# Patient Record
Sex: Female | Born: 1970 | Race: White | Hispanic: No | Smoking: Current every day smoker
Health system: Southern US, Community
[De-identification: ages and names within clinical notes are randomized; demographics above are authoritative.]

## PROBLEM LIST (undated history)

## (undated) DIAGNOSIS — I10 Essential (primary) hypertension: Secondary | ICD-10-CM

## (undated) DIAGNOSIS — N946 Dysmenorrhea, unspecified: Secondary | ICD-10-CM

## (undated) DIAGNOSIS — D649 Anemia, unspecified: Secondary | ICD-10-CM

## (undated) DIAGNOSIS — G8929 Other chronic pain: Secondary | ICD-10-CM

## (undated) DIAGNOSIS — N920 Excessive and frequent menstruation with regular cycle: Secondary | ICD-10-CM

## (undated) DIAGNOSIS — R102 Pelvic and perineal pain: Secondary | ICD-10-CM

## (undated) DIAGNOSIS — E559 Vitamin D deficiency, unspecified: Secondary | ICD-10-CM

## (undated) DIAGNOSIS — N809 Endometriosis, unspecified: Secondary | ICD-10-CM

## (undated) HISTORY — DX: Anemia, unspecified: D64.9

## (undated) HISTORY — DX: Essential (primary) hypertension: I10

## (undated) HISTORY — DX: Vitamin D deficiency, unspecified: E55.9

## (undated) HISTORY — DX: Excessive and frequent menstruation with regular cycle: N92.0

## (undated) HISTORY — DX: Dysmenorrhea, unspecified: N94.6

## (undated) HISTORY — PX: WISDOM TOOTH EXTRACTION: SHX21

## (undated) HISTORY — DX: Pelvic and perineal pain: R10.2

## (undated) HISTORY — DX: Endometriosis, unspecified: N80.9

## (undated) HISTORY — DX: Other chronic pain: G89.29

---

## 2013-04-06 ENCOUNTER — Encounter: Payer: Self-pay | Admitting: Nurse Practitioner

## 2013-05-10 ENCOUNTER — Encounter: Payer: Self-pay | Admitting: Nurse Practitioner

## 2013-08-10 ENCOUNTER — Emergency Department (HOSPITAL_BASED_OUTPATIENT_CLINIC_OR_DEPARTMENT_OTHER)
Admission: EM | Admit: 2013-08-10 | Discharge: 2013-08-10 | Discharge status: Against Medical Advice | Payer: BC Managed Care – PPO | Attending: Emergency Medicine | Admitting: Emergency Medicine

## 2013-08-10 ENCOUNTER — Encounter (HOSPITAL_BASED_OUTPATIENT_CLINIC_OR_DEPARTMENT_OTHER): Payer: Self-pay | Admitting: Emergency Medicine

## 2013-08-10 DIAGNOSIS — S46909A Unspecified injury of unspecified muscle, fascia and tendon at shoulder and upper arm level, unspecified arm, initial encounter: Secondary | ICD-10-CM | POA: Insufficient documentation

## 2013-08-10 DIAGNOSIS — Y9389 Activity, other specified: Secondary | ICD-10-CM | POA: Insufficient documentation

## 2013-08-10 DIAGNOSIS — F17200 Nicotine dependence, unspecified, uncomplicated: Secondary | ICD-10-CM | POA: Insufficient documentation

## 2013-08-10 DIAGNOSIS — X58XXXA Exposure to other specified factors, initial encounter: Secondary | ICD-10-CM | POA: Insufficient documentation

## 2013-08-10 DIAGNOSIS — Y929 Unspecified place or not applicable: Secondary | ICD-10-CM | POA: Insufficient documentation

## 2013-08-10 DIAGNOSIS — S4980XA Other specified injuries of shoulder and upper arm, unspecified arm, initial encounter: Secondary | ICD-10-CM | POA: Insufficient documentation

## 2013-08-10 NOTE — ED Notes (Signed)
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
## 2014-04-29 HISTORY — PX: PARTIAL HYSTERECTOMY: SHX80

## 2019-11-12 ENCOUNTER — Encounter: Payer: Self-pay | Admitting: Nurse Practitioner

## 2019-11-12 ENCOUNTER — Other Ambulatory Visit (INDEPENDENT_AMBULATORY_CARE_PROVIDER_SITE_OTHER): Payer: BC Managed Care – PPO

## 2019-11-12 ENCOUNTER — Ambulatory Visit (INDEPENDENT_AMBULATORY_CARE_PROVIDER_SITE_OTHER): Payer: BC Managed Care – PPO | Admitting: Nurse Practitioner

## 2019-11-12 VITALS — BP 142/86 | HR 73 | Ht 63.0 in | Wt 152.2 lb

## 2019-11-12 DIAGNOSIS — R197 Diarrhea, unspecified: Secondary | ICD-10-CM | POA: Diagnosis not present

## 2019-11-12 DIAGNOSIS — R1032 Left lower quadrant pain: Secondary | ICD-10-CM | POA: Diagnosis not present

## 2019-11-12 DIAGNOSIS — R1012 Left upper quadrant pain: Secondary | ICD-10-CM

## 2019-11-12 LAB — C-REACTIVE PROTEIN: CRP: <1 mg/dL (ref 0.5–20.0)

## 2019-11-12 LAB — CBC WITH DIFFERENTIAL/PLATELET
Basophils Absolute: 0.1 10*3/uL (ref 0.0–0.1)
Basophils Relative: 0.7 % (ref 0.0–3.0)
Eosinophils Absolute: 0.1 10*3/uL (ref 0.0–0.7)
Eosinophils Relative: 1.5 % (ref 0.0–5.0)
HCT: 45.9 % (ref 36.0–46.0)
Hemoglobin: 15.3 g/dL — ABNORMAL HIGH (ref 12.0–15.0)
Lymphocytes Relative: 26.6 % (ref 12.0–46.0)
Lymphs Abs: 2.7 10*3/uL (ref 0.7–4.0)
MCHC: 33.3 g/dL (ref 30.0–36.0)
MCV: 86.6 fl (ref 78.0–100.0)
Monocytes Absolute: 0.8 10*3/uL (ref 0.1–1.0)
Monocytes Relative: 8.3 % (ref 3.0–12.0)
Neutro Abs: 6.4 10*3/uL (ref 1.4–7.7)
Neutrophils Relative %: 62.9 % (ref 43.0–77.0)
Platelets: 330 10*3/uL (ref 150.0–400.0)
RBC: 5.3 Mil/uL — ABNORMAL HIGH (ref 3.87–5.11)
RDW: 14.3 % (ref 11.5–15.5)
WBC: 10.1 10*3/uL (ref 4.0–10.5)

## 2019-11-12 LAB — BASIC METABOLIC PANEL
BUN: 11 mg/dL (ref 6–23)
CO2: 26 mEq/L (ref 19–32)
Calcium: 9.8 mg/dL (ref 8.4–10.5)
Chloride: 101 mEq/L (ref 96–112)
Creatinine, Ser: 0.65 mg/dL (ref 0.40–1.20)
GFR: 96.17 mL/min (ref >60.00)
Glucose, Bld: 91 mg/dL (ref 70–99)
Potassium: 3.7 mEq/L (ref 3.5–5.1)
Sodium: 136 mEq/L (ref 135–145)

## 2019-11-12 LAB — IGA: IgA: 249 mg/dL (ref 68–378)

## 2019-11-12 MED ORDER — NA SULFATE-K SULFATE-MG SULF 17.5-3.13-1.6 GM/177ML PO SOLN: 1.0000 | Freq: Once | ORAL | 0 refills | Status: AC

## 2019-11-12 MED ORDER — DICYCLOMINE HCL 20 MG PO TABS: 20.0000 mg | ORAL_TABLET | Freq: Two times a day (BID) | ORAL | 1 refills | Status: AC

## 2019-11-12 NOTE — Progress Notes (Signed)
11/12/2019 April Blackburn [id number] 12/28/1970   CHIEF COMPLAINT: Diarrhea, LUQ and LLQ pain   HISTORY OF PRESENT ILLNESS: April Blackburn is a 49 year old female with a remote history of anemia secondary to menorrhagis, hypertension, fatty liver and Vitamin D deficiency. Partial hysterectomy 2016. She presents today as referred by her primary care provider Nena Polio NP for further evaluation for diarrhea and left upper and left lower abdominal pain. She developed LUQ/LLQ pain which radiated to her flank and back with associated  nonbloody diarrhea which started on 09/29/2019. Her abdominal pain comes and goes, some days is constant. Sometimes eating worsens here abdominal pain. No specific food triggers. She is intermittently taking Imodium one tab 4 to 6 times. No new medications within the past 6 months. She was seen by her PCP 6/7 and an abdominal/pelvic CT was done which showed a normal bowel and colon. No evidence of any acute intra abdominal/pelvic inflammatory or infectious process. Hepatic steatosis and small bilateral renal cysts were noted.  Her abdominal pain continued and she was seen by her  PCP on  6/14, she was  prescribed Methylprednisolone taper which she took for 4 days then stopped due to having significant anxiety while taking it. Past history of anxiety induced on Prednisone.  She was then prescribed a course of Cipro 500mg  po bid  on 10/18/2019 x 14 days without improvement. Labs done 10/18/2019 showed a WBC 9.3. Hg 15.1. HCT 47.4. PLT 298. Sed rate 3. Glu 85. BUN 6. Cr. 0.61. NA 139. Ca 9.3. Albumin 4.2. T. Bili 0.4. AST 10. ALT 18. She has lost 4lbs over the past 6 weeks.   She reports having a history of "IBS-D" for the past 2 to 3 years. She describes having episodes of N/V/D with lower generalized abdominal cramping, contraction like abdominal pain with sweats and sometimes feels flushed which occurs once every 3 to 4 months and lasts for less than one day. No specific food  or stress triggers. She's never had a colonoscopy. She takes Dicyclomine bid for the past 2 years. No family history of IBD. Paternal great uncle had colon cancer. She rarely takes NSAIDS, once or twice monthly for headaches.    She reports having excessive vomiting with sedation and anesthesia.   Abdominal/Pelvic CT with contrast 10/04/2019 at [facility]: 1. No acute abnormalities or suggested etiology for symptoms. 2. Hepatic steatosis. 3. Small bilateral renal cortical cysts  Past Medical History:  Diagnosis Date  . Anemia   . Chronic headaches   . Dysmenorrhea   . Endometriosis determined by laparoscopy   . Hypertension   . Menorrhagia with regular cycle   . Pelvic pain in female   . Vitamin D deficiency    Past Surgical History:  Procedure Laterality Date  . PARTIAL HYSTERECTOMY  2016  . WISDOM TOOTH EXTRACTION     Social History: Divorced. She has 2 daughters and 1 son. She smokes cigarettes 1ppd x 25 - 30 years. One alcoholic drink once 5 or 6 months. No drug use.   Family History: Mother died 41 due to hemorrhage in brain. Father age 3 history of colon polyps. Paternal great uncle colon cancer. Maternal grandmother pancreatic cancer. Maternal great great grandmother stomach cancer. Great maternal aunt with breast cancer.    Allergies  Allergen Reactions  . Prednisone Anxiety    Other reaction(s): Agitation, Chest Pain, Confusion, Other Patient had really bad mood swings  . Other     Other reaction(s):  Agitation  . Penicillins Hives  . Tylox [Oxycodone-Acetaminophen]     Violent      Outpatient Encounter Medications as of 11/12/2019  Medication Sig  . amLODipine (NORVASC) 10 MG tablet Take 10 mg by mouth daily.   . Biotin 2.5 MG CAPS Take 1 tablet by mouth daily.  Marland Kitchen dicyclomine (BENTYL) 20 MG tablet Take 1 tablet by mouth 2 (two) times daily.  . ergocalciferol (VITAMIN D2) 1.25 MG (50000 UT) capsule Take by mouth.  . Loperamide HCl (IMODIUM PO) Take 1 tablet  by mouth daily as needed.  . naproxen (NAPROSYN) 250 MG tablet TAKE 1 TABLET(250 MG) BY MOUTH TWICE DAILY WITH MEALS  . olmesartan-hydrochlorothiazide (BENICAR HCT) 20-12.5 MG tablet Take 1 tablet by mouth daily.  . [DISCONTINUED] cholecalciferol (VITAMIN D) 1000 UNITS tablet Take 1,000 Units by mouth daily. (Patient not taking: Reported on 11/12/2019)  . [DISCONTINUED] pravastatin (PRAVACHOL) 20 MG tablet Take by mouth. (Patient not taking: Reported on 11/12/2019)   No facility-administered encounter medications on file as of 11/12/2019.     REVIEW OF SYSTEMS:   Gen: + sweats, weight loss. No fever.   CV: Denies chest pain, palpitations or edema. Resp: Denies cough, shortness of breath of hemoptysis.  GI: See HPI.  GU : Denies urinary burning, blood in urine, increased urinary frequency or incontinence. MS: Denies joint pain, muscles aches or weakness. Derm: Denies rash, itchiness, skin lesions or unhealing ulcers. Psych: Denies depression, anxiety, memory loss or confusion. Heme: Denies bruising, bleeding. Neuro:  Denies headaches, dizziness or paresthesias. Endo:  Denies any problems with DM, thyroid or adrenal function.  PHYSICAL EXAM: BP (!) 142/86 (BP Location: Left Arm, Patient Position: Sitting, Cuff Size: Normal)   Pulse 73   Ht 5\' 3"  (1.6 m)   Wt 152 lb 4 oz (69.1 kg)   SpO2 97%   BMI 26.97 kg/m     General: Well developed  49 year old female in no acute distress. Head: Normocephalic and atraumatic. Eyes:  Sclerae non-icteric, conjunctive pink. Ears: Normal auditory acuity. Mouth: Dentition intact. No ulcers or lesions.  Neck: Supple, no lymphadenopathy or thyromegaly.  Lungs: Clear bilaterally to auscultation without wheezes, crackles or rhonchi. Heart: Regular rate and rhythm. No murmur, rub or gallop appreciated.  Abdomen: Soft, non distended. Moderate LUQ and LLQ tenderness without rebound or guarding. Mild right CVA tenderness, no left CVA tenderness. No masses.  No hepatosplenomegaly. Normoactive bowel sounds x 4 quadrants.  Rectal: Deferred.  Musculoskeletal: Symmetrical with no gross deformities. Skin: Warm and dry. No rash or lesions on visible extremities. Extremities: No edema. Neurological: Alert oriented x 4, no focal deficits.  Psychological:  Alert and cooperative. Normal mood and affect.  ASSESSMENT AND PLAN:  22. 49 year old female with LUQ, LLQ pain and diarrhea since 09/29/2019. -GI pathogen panel, CBC, BMP, CRP, IgA and tTg level -EGD/Colonoscopy to rule out PUD, celiac disease, IBD, microscopic colitis and upper/lower GI malignancy. EGD/colonosocpy benefits and risks discussed including risk with sedation, risk of bleeding, perforation and infection  -Dicyclomine 20mg  po bid PRN  -Patient to call our office if he symptoms worsen.  -No NSAIDs  2. Episodic N/V/D with lower abdominal pain, sweats and sometimes flushing which occurs once monthly over the past 2 to 3 years. Etiology unclear: IBD vs neuroendocrine tumors (carcinoid syndrome) vs  ace inhibitor induced abdominal angioedema.  -Recommend abd/pelvic CT angiogram to be done at time of next attack. -Recommend checking C1 esterase inhibitor and C 4 levels at time  of next attack.  -Diagnostic EGD/colonoscopy as noted above  -Patient to call our office at time of next attack   3. HTN on Olmesartan.  -Will check with PCP to verify when Olmesartan HCTZ was initially prescribed   4. Fatty liver per CTAP. LFTs normal.          CC:  Nena Polio, NP

## 2019-11-12 NOTE — Patient Instructions (Signed)
If you are age 51 or older, your body mass index should be between 23-30. Your Body mass index is 26.97 kg/m. If this is out of the aforementioned range listed, please consider follow up with your Primary Care Provider.  If you are age 16 or younger, your body mass index should be between 19-25. Your Body mass index is 26.97 kg/m. If this is out of the aformentioned range listed, please consider follow up with your Primary Care Provider.   Your provider has requested that you go to the basement level for lab work before leaving today. Press "B" on the elevator. The lab is located at the first door on the left as you exit the elevator.  We have sent the following medications to your pharmacy for you to pick up at your convenience: suprep Dicyclomine 20mg  1 tabled twice a day for abdominal cramping as needed  Due to recent changes in healthcare laws, you may see the results of your imaging and laboratory studies on MyChart before your provider has had a chance to review them.  We understand that in some cases there may be results that are confusing or concerning to you. Not all laboratory results come back in the same time frame and the provider may be waiting for multiple results in order to interpret others.  Please give [place] 48 hours in order for your provider to thoroughly review all the results before contacting the office for clarification of your results.

## 2019-11-13 DIAGNOSIS — R1032 Left lower quadrant pain: Secondary | ICD-10-CM | POA: Insufficient documentation

## 2019-11-13 DIAGNOSIS — R197 Diarrhea, unspecified: Secondary | ICD-10-CM | POA: Insufficient documentation

## 2019-11-13 DIAGNOSIS — R1012 Left upper quadrant pain: Secondary | ICD-10-CM | POA: Insufficient documentation

## 2019-11-15 LAB — TISSUE TRANSGLUTAMINASE, IGA: (tTG) Ab, IgA: 1 U/mL

## 2019-11-17 ENCOUNTER — Telehealth: Payer: Self-pay | Admitting: Nurse Practitioner

## 2019-11-17 NOTE — Telephone Encounter (Signed)
Beth, I just spoke to the patient. No BM for almost 2 days. No diarrhea therefore GI pathogen panel not done. She is not having an "attack" of abd pain, N/V/D, sweats and flushing. So no stat labs or abd/pelvic CTA today. She will take Dicyclomine 20mg  tid, Miralax and she will call office tomorrow with an update. She will go to the ED if she has severe pain.   If she has worsening abd pain with N/V/D and sweats then labs to include CBC, CMP, C1 esterase inhibitor and C4 levels with stat abd/pelvic CT angio would be best option. Thx

## 2019-11-17 NOTE — Telephone Encounter (Signed)
Pain comes and goes but has been consistent today. She has moved slowly today. The pain started in the back below ribcage and has moved to the front below the ribs.

## 2019-11-17 NOTE — Telephone Encounter (Signed)
She mentioned the CT. Stated if she was hurting tomorrow she would not go to work. Can you give me a time within which the CT should be done (STAT or ASAP)? What diagnosis do you want to use.

## 2019-11-17 NOTE — Telephone Encounter (Signed)
Patient was told to give [place] a call if the pain on her side gets worse. Pt states that pain today is much worse and is radiating to her back.

## 2019-11-17 NOTE — Telephone Encounter (Signed)
April Blackburn, I called the patient and left a message on her voicemail to call me back. If she is having severe abdominal pain then she should go to the ER. If she is having an episode of N/V/D and sweats or  flushing then please schedule her for an abd/pelvic CT angiogram asap and labs to be done include CBC, CMP, C1 esterase inhibitor and C4. Levels.  Note, I am not in the office tomorrow 7/22.

## 2019-11-18 ENCOUNTER — Other Ambulatory Visit: Payer: Self-pay

## 2019-11-18 ENCOUNTER — Ambulatory Visit (HOSPITAL_BASED_OUTPATIENT_CLINIC_OR_DEPARTMENT_OTHER)
Admission: RE | Admit: 2019-11-18 | Discharge: 2019-11-18 | Disposition: A | Discharge status: Home / Self Care | Payer: BC Managed Care – PPO | Source: Ambulatory Visit | Attending: Nurse Practitioner | Admitting: Nurse Practitioner

## 2019-11-18 ENCOUNTER — Other Ambulatory Visit (INDEPENDENT_AMBULATORY_CARE_PROVIDER_SITE_OTHER): Payer: BC Managed Care – PPO

## 2019-11-18 DIAGNOSIS — R197 Diarrhea, unspecified: Secondary | ICD-10-CM

## 2019-11-18 DIAGNOSIS — R1032 Left lower quadrant pain: Secondary | ICD-10-CM | POA: Diagnosis not present

## 2019-11-18 DIAGNOSIS — A09 Infectious gastroenteritis and colitis, unspecified: Secondary | ICD-10-CM | POA: Insufficient documentation

## 2019-11-18 DIAGNOSIS — R1012 Left upper quadrant pain: Secondary | ICD-10-CM | POA: Diagnosis not present

## 2019-11-18 LAB — COMPREHENSIVE METABOLIC PANEL
ALT: 17 U/L (ref 0–35)
AST: 12 U/L (ref 0–37)
Albumin: 4.4 g/dL (ref 3.5–5.2)
Alkaline Phosphatase: 46 U/L (ref 39–117)
BUN: 18 mg/dL (ref 6–23)
CO2: 27 mEq/L (ref 19–32)
Calcium: 9.4 mg/dL (ref 8.4–10.5)
Chloride: 101 mEq/L (ref 96–112)
Creatinine, Ser: 0.68 mg/dL (ref 0.40–1.20)
GFR: 91.28 mL/min (ref >60.00)
Glucose, Bld: 99 mg/dL (ref 70–99)
Potassium: 3.7 mEq/L (ref 3.5–5.1)
Sodium: 135 mEq/L (ref 135–145)
Total Bilirubin: 0.9 mg/dL (ref 0.2–1.2)
Total Protein: 7.2 g/dL (ref 6.0–8.3)

## 2019-11-18 LAB — CBC WITH DIFFERENTIAL/PLATELET
Basophils Absolute: 0 10*3/uL (ref 0.0–0.1)
Basophils Relative: 0.5 % (ref 0.0–3.0)
Eosinophils Absolute: 0.1 10*3/uL (ref 0.0–0.7)
Eosinophils Relative: 1.3 % (ref 0.0–5.0)
HCT: 46.9 % — ABNORMAL HIGH (ref 36.0–46.0)
Hemoglobin: 15.6 g/dL — ABNORMAL HIGH (ref 12.0–15.0)
Lymphocytes Relative: 24.8 % (ref 12.0–46.0)
Lymphs Abs: 2.5 10*3/uL (ref 0.7–4.0)
MCHC: 33.2 g/dL (ref 30.0–36.0)
MCV: 87.5 fl (ref 78.0–100.0)
Monocytes Absolute: 0.7 10*3/uL (ref 0.1–1.0)
Monocytes Relative: 7.3 % (ref 3.0–12.0)
Neutro Abs: 6.7 10*3/uL (ref 1.4–7.7)
Neutrophils Relative %: 66.1 % (ref 43.0–77.0)
Platelets: 339 10*3/uL (ref 150.0–400.0)
RBC: 5.36 Mil/uL — ABNORMAL HIGH (ref 3.87–5.11)
RDW: 13.9 % (ref 11.5–15.5)
WBC: 10.1 10*3/uL (ref 4.0–10.5)

## 2019-11-18 MED ORDER — IOHEXOL 350 MG/ML SOLN
100.0000 mL | Freq: Once | INTRAVENOUS | Status: AC | PRN
  Administered 2019-11-18: 100 mL via INTRAVENOUS

## 2019-11-18 NOTE — Telephone Encounter (Signed)
CT angiogram is authorized by her insurance. She is presently getting her labs drawn. Reports she did have some passage of stool this morning. She last ate at 12:30 pm.

## 2019-11-18 NOTE — Telephone Encounter (Signed)
Dr Tarri Glenn, you may get a call with the results on this patient's CT. The STAT has turned into an after hours.  [facility] is beginning her imaging as soon as she gets there. The patient will be released after her imaging. She knows she may not get any information tonight.  Patient seen by Carl Best

## 2019-11-18 NOTE — Telephone Encounter (Signed)
Patient wants to go forward with labs and the CT. She is still in pain despite following the recommendations.

## 2019-11-18 NOTE — Telephone Encounter (Signed)
Pt is requesting a call back from a nurse to discuss the options Jaclyn Shaggy suggested

## 2019-11-18 NOTE — Telephone Encounter (Signed)
Noted! Thank you

## 2019-11-19 ENCOUNTER — Other Ambulatory Visit: Payer: BC Managed Care – PPO

## 2019-11-19 DIAGNOSIS — R197 Diarrhea, unspecified: Secondary | ICD-10-CM

## 2019-11-19 DIAGNOSIS — R1012 Left upper quadrant pain: Secondary | ICD-10-CM

## 2019-11-19 DIAGNOSIS — R1032 Left lower quadrant pain: Secondary | ICD-10-CM

## 2019-11-19 LAB — C1 ESTERASE INHIBITOR: C1INH SerPl-mCnc: 25 mg/dL (ref 21–39)

## 2019-11-19 LAB — C4 COMPLEMENT: Complement C4, Serum: 27 mg/dL (ref 12–38)

## 2019-11-21 LAB — GI PROFILE, STOOL, PCR

## 2019-11-22 ENCOUNTER — Other Ambulatory Visit: Payer: Self-pay

## 2019-11-22 DIAGNOSIS — D582 Other hemoglobinopathies: Secondary | ICD-10-CM

## 2019-11-22 MED ORDER — CIPROFLOXACIN HCL 500 MG PO TABS: 500.0000 mg | ORAL_TABLET | Freq: Two times a day (BID) | ORAL | 0 refills | Status: AC

## 2019-11-23 ENCOUNTER — Other Ambulatory Visit (INDEPENDENT_AMBULATORY_CARE_PROVIDER_SITE_OTHER): Payer: BC Managed Care – PPO

## 2019-11-23 ENCOUNTER — Telehealth: Payer: Self-pay

## 2019-11-23 DIAGNOSIS — D582 Other hemoglobinopathies: Secondary | ICD-10-CM | POA: Diagnosis not present

## 2019-11-23 LAB — FERRITIN: Ferritin: 84.2 ng/mL (ref 10.0–291.0)

## 2019-11-23 LAB — IRON: Iron: 43 ug/dL (ref 42–145)

## 2019-11-23 NOTE — Telephone Encounter (Signed)
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  Please inform patient the results. Thanks   Patient notified of her results and treatment plan.  Her endoscopy is 12/13/19.

## 2019-11-29 ENCOUNTER — Encounter: Payer: Self-pay | Admitting: Gastroenterology

## 2019-11-30 NOTE — Progress Notes (Signed)
Reviewed and agree with documentation and assessment and plan. K. Veena Lillyona Polasek , MD   

## 2019-12-09 ENCOUNTER — Ambulatory Visit (INDEPENDENT_AMBULATORY_CARE_PROVIDER_SITE_OTHER): Payer: BC Managed Care – PPO

## 2019-12-09 ENCOUNTER — Other Ambulatory Visit: Payer: Self-pay | Admitting: Gastroenterology

## 2019-12-09 DIAGNOSIS — Z1159 Encounter for screening for other viral diseases: Secondary | ICD-10-CM

## 2019-12-09 LAB — SARS CORONAVIRUS 2 (TAT 6-24 HRS): SARS Coronavirus 2: NEGATIVE

## 2019-12-13 ENCOUNTER — Encounter: Payer: Self-pay | Admitting: Gastroenterology

## 2019-12-13 ENCOUNTER — Ambulatory Visit (AMBULATORY_SURGERY_CENTER): Payer: BC Managed Care – PPO | Admitting: Gastroenterology

## 2019-12-13 ENCOUNTER — Other Ambulatory Visit: Payer: Self-pay

## 2019-12-13 VITALS — BP 141/75 | HR 63 | Temp 98.7°F | Resp 15 | Ht 63.0 in | Wt 152.0 lb

## 2019-12-13 DIAGNOSIS — R197 Diarrhea, unspecified: Secondary | ICD-10-CM | POA: Diagnosis not present

## 2019-12-13 DIAGNOSIS — R1012 Left upper quadrant pain: Secondary | ICD-10-CM

## 2019-12-13 DIAGNOSIS — K449 Diaphragmatic hernia without obstruction or gangrene: Secondary | ICD-10-CM

## 2019-12-13 DIAGNOSIS — K319 Disease of stomach and duodenum, unspecified: Secondary | ICD-10-CM | POA: Diagnosis not present

## 2019-12-13 DIAGNOSIS — K621 Rectal polyp: Secondary | ICD-10-CM | POA: Diagnosis not present

## 2019-12-13 DIAGNOSIS — K2970 Gastritis, unspecified, without bleeding: Secondary | ICD-10-CM | POA: Diagnosis not present

## 2019-12-13 DIAGNOSIS — K2990 Gastroduodenitis, unspecified, without bleeding: Secondary | ICD-10-CM

## 2019-12-13 DIAGNOSIS — K649 Unspecified hemorrhoids: Secondary | ICD-10-CM | POA: Diagnosis not present

## 2019-12-13 DIAGNOSIS — R1032 Left lower quadrant pain: Secondary | ICD-10-CM

## 2019-12-13 DIAGNOSIS — D128 Benign neoplasm of rectum: Secondary | ICD-10-CM

## 2019-12-13 MED ORDER — SODIUM CHLORIDE 0.9 % IV SOLN: 500.0000 mL | Freq: Once | INTRAVENOUS | Status: DC

## 2019-12-13 NOTE — Progress Notes (Signed)
Called to room to assist during endoscopic procedure.  Patient ID and intended procedure confirmed with present staff. Received instructions for my participation in the procedure from the performing physician.  

## 2019-12-13 NOTE — Op Note (Signed)
[place] Patient Name: April Blackburn Procedure Date: 12/13/2019 4:06 PM MRN: [id number] Endoscopist: Mauri Pole , MD Age: 49 Referring MD:  Date of Birth: 14-Jun-1970 Gender: Female Account #: [id number] Procedure:                Upper GI endoscopy Indications:              Epigastric abdominal pain, Abdominal pain in the                            left upper quadrant, Suspected esophageal reflux,                            Nausea Medicines:                Monitored Anesthesia Care Procedure:                Pre-Anesthesia Assessment:                           - Prior to the procedure, a History and Physical                            was performed, and patient medications and                            allergies were reviewed. The patient's tolerance of                            previous anesthesia was also reviewed. The risks                            and benefits of the procedure and the sedation                            options and risks were discussed with the patient.                            All questions were answered, and informed consent                            was obtained. Prior Anticoagulants: The patient has                            taken no previous anticoagulant or antiplatelet                            agents. ASA Grade Assessment: II - A patient with                            mild systemic disease. After reviewing the risks                            and benefits, the patient was deemed in  satisfactory condition to undergo the procedure.                           After obtaining informed consent, the endoscope was                            passed under direct vision. Throughout the                            procedure, the patient's blood pressure, pulse, and                            oxygen saturations were monitored continuously. The                            Endoscope was introduced through the mouth,  and                            advanced to the second part of duodenum. The upper                            GI endoscopy was accomplished without difficulty.                            The patient tolerated the procedure well. Scope In: Scope Out: Findings:                 The Z-line was regular and was found 36 cm from the                            incisors.                           A small hiatal hernia was present.                           Patchy mild inflammation characterized by adherent                            blood, congestion (edema), erythema and friability                            was found in the entire examined stomach. Biopsies                            were taken with a cold forceps for Helicobacter                            pylori testing.                           The first portion of the duodenum and second                            portion of the duodenum were normal. Biopsies  for                            histology were taken with a cold forceps for                            evaluation of celiac disease. Biopsies for                            histology were taken with a cold forceps for                            evaluation of celiac disease. Complications:            No immediate complications. Estimated Blood Loss:     Estimated blood loss was minimal. Impression:               - Z-line regular, 36 cm from the incisors.                           - Small hiatal hernia.                           - Gastritis. Biopsied.                           - Normal first portion of the duodenum and second                            portion of the duodenum. Biopsied. Recommendation:           - Patient has a contact number available for                            emergencies. The signs and symptoms of potential                            delayed complications were discussed with the                            patient. Return to normal activities tomorrow.                             Written discharge instructions were provided to the                            patient.                           - Resume previous diet.                           - Continue present medications.                           - Await pathology results.                           -  See the other procedure note for documentation of                            additional recommendations. Mauri Pole, MD 12/13/2019 4:37:46 PM This report has been signed electronically.

## 2019-12-13 NOTE — Progress Notes (Signed)
Pt's states no medical or surgical changes since previsit or office visit.  Vitals- [name] 

## 2019-12-13 NOTE — Op Note (Signed)
[place] Patient Name: April Blackburn Procedure Date: 12/13/2019 4:06 PM MRN: [id number] Endoscopist: Mauri Pole , MD Age: 49 Referring MD:  Date of Birth: [birth year] Gender: Female Account #: [id number] Procedure:                Colonoscopy Indications:              Clinically significant diarrhea of unexplained                            origin, Abdominal pain in the left lower quadrant Medicines:                Monitored Anesthesia Care Procedure:                Pre-Anesthesia Assessment:                           - Prior to the procedure, a History and Physical                            was performed, and patient medications and                            allergies were reviewed. The patient's tolerance of                            previous anesthesia was also reviewed. The risks                            and benefits of the procedure and the sedation                            options and risks were discussed with the patient.                            All questions were answered, and informed consent                            was obtained. Prior Anticoagulants: The patient has                            taken no previous anticoagulant or antiplatelet                            agents. ASA Grade Assessment: II - A patient with                            mild systemic disease. After reviewing the risks                            and benefits, the patient was deemed in                            satisfactory condition to undergo the procedure.  After obtaining informed consent, the colonoscope                            was passed under direct vision. Throughout the                            procedure, the patient's blood pressure, pulse, and                            oxygen saturations were monitored continuously. The                            Colonoscope was introduced through the anus and                            advanced to  the the cecum, identified by                            appendiceal orifice and ileocecal valve. The                            colonoscopy was performed without difficulty. The                            patient tolerated the procedure well. The quality                            of the bowel preparation was good. The ileocecal                            valve, appendiceal orifice, and rectum were                            photographed. Scope In: 4:20:07 PM Scope Out: 4:32:39 PM Scope Withdrawal Time: 0 hours 9 minutes 3 seconds  Total Procedure Duration: 0 hours 12 minutes 32 seconds  Findings:                 The perianal and digital rectal examinations were                            normal.                           Two sessile polyps were found in the rectum. The                            polyps were 1 to 2 mm in size. These polyps were                            removed with a cold biopsy forceps. Resection and                            retrieval were complete.  Normal mucosa was found in the entire colon.                            Biopsies for histology were taken with a cold                            forceps from the right colon and left colon for                            evaluation of microscopic colitis.                           Non-bleeding internal hemorrhoids were found during                            retroflexion. The hemorrhoids were medium-sized. Complications:            No immediate complications. Estimated Blood Loss:     Estimated blood loss was minimal. Impression:               - Two 1 to 2 mm polyps in the rectum, removed with                            a cold biopsy forceps. Resected and retrieved.                           - Normal mucosa in the entire examined colon.                            Biopsied.                           - Non-bleeding internal hemorrhoids. Recommendation:           - Patient has a contact number  available for                            emergencies. The signs and symptoms of potential                            delayed complications were discussed with the                            patient. Return to normal activities tomorrow.                            Written discharge instructions were provided to the                            patient.                           - Resume previous diet.                           - Continue present medications.                           -  Await pathology results.                           - Repeat colonoscopy in 5-10 years for surveillance                            based on pathology results. Mauri Pole, MD 12/13/2019 4:40:12 PM This report has been signed electronically.

## 2019-12-13 NOTE — Patient Instructions (Addendum)
Handouts provided on polyps, hemorrhoids, gastritis and hiatal hernia.   No aspirin, ibuprofen, naproxen, or other non-steriodal anti-inflammatory drugs for 2 weeks after polyp removal.    YOU HAD AN ENDOSCOPIC PROCEDURE TODAY AT [facility]:   Refer to the procedure report that was given to you for any specific questions about what was found during the examination.  If the procedure report does not answer your questions, please call your gastroenterologist to clarify.  If you requested that your care partner not be given the details of your procedure findings, then the procedure report has been included in a sealed envelope for you to review at your convenience later.  YOU SHOULD EXPECT: Some feelings of bloating in the abdomen. Passage of more gas than usual.  Walking can help get rid of the air that was put into your GI tract during the procedure and reduce the bloating. If you had a lower endoscopy (such as a colonoscopy or flexible sigmoidoscopy) you may notice spotting of blood in your stool or on the toilet paper. If you underwent a bowel prep for your procedure, you may not have a normal bowel movement for a few days.  Please Note:  You might notice some irritation and congestion in your nose or some drainage.  This is from the oxygen used during your procedure.  There is no need for concern and it should clear up in a day or so.  SYMPTOMS TO REPORT IMMEDIATELY:   Following lower endoscopy (colonoscopy or flexible sigmoidoscopy):  Excessive amounts of blood in the stool  Significant tenderness or worsening of abdominal pains  Swelling of the abdomen that is new, acute  Fever of 100F or higher   Following upper endoscopy (EGD)  Vomiting of blood or coffee ground material  New chest pain or pain under the shoulder blades  Painful or persistently difficult swallowing  New shortness of breath  Fever of 100F or higher  Black, tarry-looking stools  For urgent or  emergent issues, a gastroenterologist can be reached at any hour by calling [phone]. Do not use MyChart messaging for urgent concerns.    DIET:  We do recommend a small meal at first, but then you may proceed to your regular diet.  Drink plenty of fluids but you should avoid alcoholic beverages for 24 hours.  ACTIVITY:  You should plan to take it easy for the rest of today and you should NOT DRIVE or use heavy machinery until tomorrow (because of the sedation medicines used during the test).    FOLLOW UP: Our staff will call the number listed on your records 48-72 hours following your procedure to check on you and address any questions or concerns that you may have regarding the information given to you following your procedure. If we do not reach you, we will leave a message.  We will attempt to reach you two times.  During this call, we will ask if you have developed any symptoms of COVID 19. If you develop any symptoms (ie: fever, flu-like symptoms, shortness of breath, cough etc.) before then, please call [phone].  If you test positive for Covid 19 in the 2 weeks post procedure, please call and report this information to [place].    If any biopsies were taken you will be contacted by phone or by letter within the next 1-3 weeks.  Please call us at [phone] if you have not heard about the biopsies in 3 weeks.    SIGNATURES/CONFIDENTIALITY: You  and/or your care partner have signed paperwork which will be entered into your electronic medical record.  These signatures attest to the fact that that the information above on your After Visit Summary has been reviewed and is understood.  Full responsibility of the confidentiality of this discharge information lies with you and/or your care-partner.

## 2019-12-13 NOTE — Progress Notes (Signed)
Report to PACU, RN, vss, BBS= Clear.  

## 2019-12-15 ENCOUNTER — Telehealth: Payer: Self-pay

## 2019-12-15 NOTE — Telephone Encounter (Signed)
  Follow up Call-  Call back number 12/13/2019  Post procedure Call Back phone  # [phone]  Permission to leave phone message Yes  Some recent data might be hidden     Patient questions:  Do you have a fever, pain , or abdominal swelling? No. Pain Score  0 *  Have you tolerated food without any problems? Yes.    Have you been able to return to your normal activities? Yes.    Do you have any questions about your discharge instructions: Diet   No. Medications  No. Follow up visit  No.  Do you have questions or concerns about your Care? No.  Actions: * If pain score is 4 or above: 1. No action needed, pain <4.Have you developed a fever since your procedure? no  2.   Have you had an respiratory symptoms (SOB or cough) since your procedure? no  3.   Have you tested positive for COVID 19 since your procedure no  4.   Have you had any family members/close contacts diagnosed with the COVID 19 since your procedure?  no   If yes to any of these questions please route to Joylene John, RN and Joella Prince, RN

## 2019-12-23 ENCOUNTER — Encounter: Payer: Self-pay | Admitting: Gastroenterology

## 2020-01-31 ENCOUNTER — Encounter: Payer: Self-pay | Admitting: Gastroenterology

## 2020-01-31 ENCOUNTER — Ambulatory Visit (INDEPENDENT_AMBULATORY_CARE_PROVIDER_SITE_OTHER): Payer: BC Managed Care – PPO | Admitting: Gastroenterology

## 2020-01-31 VITALS — BP 124/64 | HR 73 | Ht 63.0 in | Wt 145.4 lb

## 2020-01-31 DIAGNOSIS — K580 Irritable bowel syndrome with diarrhea: Secondary | ICD-10-CM | POA: Diagnosis not present

## 2020-01-31 DIAGNOSIS — R109 Unspecified abdominal pain: Secondary | ICD-10-CM

## 2020-01-31 NOTE — Patient Instructions (Addendum)
Take benefiber 1 teaspoon three times a day with meals  Take IB Gard 1 capsule three times a day as needed  Ok to continue Dicyclomine as needed  Follow up in 6 months  I appreciate the  opportunity to care for you  Thank You   Harl Bowie , MD

## 2020-01-31 NOTE — Progress Notes (Signed)
Curstin Schmale    [id number]    1970/10/25  Primary Care Physician:Gibson, Jonelle Sidle, NP  Referring Physician: Nena Polio, NP [address] [place],  [place] [address]   Chief complaint: Left-sided abdominal discomfort, diarrhea  HPI:  49 year old very pleasant female here for follow-up visit for diarrhea and left lower quadrant abdominal pain  Last office visit in July 2021, seen by [facility]  Overall feels her symptoms are improving, her bowel habits are better, no longer having diarrhea.  She continues to have intermittent left side discomfort which improves when she takes dicyclomine.  Denies any constipation, nausea, vomiting, dysphagia or odynophagia.  She has changed her diet is trying to avoid processed foods and feels overall her symptoms are improving with dietary changes.  She is following a recipe book for irritable bowel syndrome.  Fatty liver: Normal LFT Ferritin and iron level, mild polycythemia TTG IgA antibody negative for celiac Normal CRP  C1 and C4 complement level within normal limits   EGD December 13, 2019: Small hiatal hernia.  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 pylori.  Duodenal biopsies negative for celiac disease. Colonoscopy December 13, 2019: 2 sessile polyps removed from rectum, hyperplastic.  Random colon biopsies negative for microscopic colitis.  Internal hemorrhoids.  Otherwise normal exam.  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 amLODipine (NORVASC) 10 MG tablet Take 10 mg by mouth daily.   .  Biotin 2.5 MG CAPS Take 1 tablet by mouth daily.  Marland Kitchen dicyclomine (BENTYL) 20 MG tablet Take 1 tablet (20 mg total) by mouth 2 (two) times daily.  . ergocalciferol (VITAMIN D2) 1.25 MG (50000 UT) capsule Take by mouth.  . Loperamide HCl (IMODIUM PO) Take 1 tablet by mouth daily as needed. (Patient not taking: Reported on 12/13/2019)  . naproxen (NAPROSYN) 250 MG tablet TAKE 1 TABLET(250 MG) BY MOUTH TWICE DAILY WITH MEALS (Patient not taking: Reported on 12/13/2019)  . olmesartan-hydrochlorothiazide (BENICAR HCT) 20-12.5 MG tablet Take 1 tablet by mouth daily.   No facility-administered encounter medications on file as of 01/31/2020.    Allergies as of 01/31/2020 - Review Complete 12/13/2019  Allergen Reaction Noted  . Prednisone Anxiety 05/09/2018  . Other  02/18/2011  . Penicillins Hives 02/18/2011  . Tylox [oxycodone-acetaminophen]  08/10/2013    Past Medical History:  Diagnosis Date  . Anemia   . Chronic headaches   . Dysmenorrhea   . Endometriosis determined by laparoscopy   . Hypertension   . Menorrhagia with regular cycle   . Pelvic pain in female   . Vitamin D deficiency     Past Surgical History:  Procedure Laterality Date  . PARTIAL HYSTERECTOMY  2016  . WISDOM TOOTH EXTRACTION      Family History  Problem Relation Age of Onset  . Colon polyps Father   . Pancreatic cancer Maternal Grandmother   . Diabetes Paternal Grandmother   . Colon cancer Other   . Stomach cancer Other     Social History   Socioeconomic History  . Marital status: Divorced    Spouse  name: Not on file  . Number of children: Not on file  . Years of education: Not on file  . Highest education level: Not on file  Occupational History  . Not on file  Tobacco Use  . Smoking status: Current Every Day Smoker    Packs/day: 0.50    Types: Cigarettes  . Smokeless tobacco: Never Used  Vaping Use  . Vaping Use: Never used  Substance and Sexual Activity  . Alcohol use: Yes    Comment: rare 1  drink every 4 - 6 months  . Drug use: No  . Sexual activity: Never  Other Topics Concern  . Not on file  Social History Narrative  . Not on file   Social Determinants of Health   Financial Resource Strain:   . Difficulty of Paying Living Expenses: Not on file  Food Insecurity:   . Worried About Charity fundraiser in the Last Year: Not on file  . Ran Out of Food in the Last Year: Not on file  Transportation Needs:   . Lack of Transportation (Medical): Not on file  . Lack of Transportation (Non-Medical): Not on file  Physical Activity:   . Days of Exercise per Week: Not on file  . Minutes of Exercise per Session: Not on file  Stress:   . Feeling of Stress : Not on file  Social Connections:   . Frequency of Communication with Friends and Family: Not on file  . Frequency of Social Gatherings with Friends and Family: Not on file  . Attends Religious Services: Not on file  . Active Member of Clubs or Organizations: Not on file  . Attends Archivist Meetings: Not on file  . Marital Status: Not on file  Intimate Partner Violence:   . Fear of Current or Ex-Partner: Not on file  . Emotionally Abused: Not on file  . Physically Abused: Not on file  . Sexually Abused: Not on file      Review of systems: All other review of systems negative except as mentioned in the HPI.   Physical Exam: Vitals:   01/31/20 0838  BP: 124/64  Pulse: 73  SpO2: 98%   Body mass index is 25.76 kg/m. Gen:      No acute distress HEENT:  sclera anicteric Abd:      soft, non-tender; no palpable masses, no distension Ext:    No edema Neuro: alert and oriented x 3 Psych: normal mood and affect  Data Reviewed:  Reviewed labs, radiology imaging, old records and pertinent past GI work up   Assessment and Plan/Recommendations:  49 year old very pleasant female here for follow-up visit for irritable bowel syndrome-diarrhea and left-sided abdominal discomfort  Reviewed the extensive GI  work-up including CT and endoscopic evaluation.  Negative for acute pathology.  IBS-D, postinfectious irritable bowel syndrome: Improving Continue with dietary changes, avoid processed foods Continue with high-fiber diet and increase water intake Benefiber 1 teaspoon 3 times daily with meals  Left side abdominal discomfort: Use IBgard 1 capsule up to 3 times daily for mild symptoms and okay to use dicyclomine 20 mg twice daily as needed for severe abdominal cramping or discomfort  Return in 6 months or sooner if needed  This visit required 32 minutes of patient care (this includes precharting, chart review, review of results, face-to-face time used for counseling as well as treatment plan and follow-up. The patient was provided an opportunity to ask questions and all were answered. The patient agreed with the  plan and demonstrated an understanding of the instructions.  Damaris Hippo , MD    CC: Nena Polio, NP

## 2021-04-20 ENCOUNTER — Encounter (HOSPITAL_BASED_OUTPATIENT_CLINIC_OR_DEPARTMENT_OTHER): Payer: Self-pay | Admitting: *Deleted

## 2021-04-20 ENCOUNTER — Other Ambulatory Visit: Payer: Self-pay

## 2021-04-20 ENCOUNTER — Emergency Department (HOSPITAL_BASED_OUTPATIENT_CLINIC_OR_DEPARTMENT_OTHER)
Admission: EM | Admit: 2021-04-20 | Discharge: 2021-04-20 | Disposition: A | Discharge status: Home / Self Care | Payer: BC Managed Care – PPO | Attending: Emergency Medicine | Admitting: Emergency Medicine

## 2021-04-20 DIAGNOSIS — I10 Essential (primary) hypertension: Secondary | ICD-10-CM | POA: Insufficient documentation

## 2021-04-20 DIAGNOSIS — K047 Periapical abscess without sinus: Secondary | ICD-10-CM | POA: Insufficient documentation

## 2021-04-20 DIAGNOSIS — F17210 Nicotine dependence, cigarettes, uncomplicated: Secondary | ICD-10-CM | POA: Insufficient documentation

## 2021-04-20 DIAGNOSIS — Z79899 Other long term (current) drug therapy: Secondary | ICD-10-CM | POA: Diagnosis not present

## 2021-04-20 MED ORDER — IBUPROFEN 600 MG PO TABS: 600.0000 mg | ORAL_TABLET | Freq: Four times a day (QID) | ORAL | 0 refills | Status: AC | PRN

## 2021-04-20 MED ORDER — BUPIVACAINE HCL 0.5 % IJ SOLN: 50.0000 mL | Freq: Once | INTRAMUSCULAR | Status: DC

## 2021-04-20 MED ORDER — BENZOCAINE 20 % MT AERO
INHALATION_SPRAY | Freq: Once | OROMUCOSAL | Status: AC
  Filled 2021-04-20: qty 57

## 2021-04-20 MED ORDER — BUPIVACAINE-EPINEPHRINE (PF) 0.5% -1:200000 IJ SOLN
1.8000 mL | Freq: Once | INTRAMUSCULAR | Status: AC
  Administered 2021-04-20: 17:00:00 1.8 mL
  Filled 2021-04-20: qty 1.8

## 2021-04-20 MED ORDER — CLINDAMYCIN HCL 150 MG PO CAPS
300.0000 mg | ORAL_CAPSULE | Freq: Once | ORAL | Status: AC
  Administered 2021-04-20: 17:00:00 300 mg via ORAL
  Filled 2021-04-20: qty 2

## 2021-04-20 NOTE — ED Notes (Signed)
Discharge instructions discussed with pt. Pt verbalized understanding. Pt stable and ambulatory.  °

## 2021-04-20 NOTE — ED Notes (Signed)
I&D tray at Bedside

## 2021-04-20 NOTE — ED Triage Notes (Signed)
She was started on antibiotics this am for a dental abscess. States she is here because she is unable to eat. She wants the abscess drained.

## 2021-04-20 NOTE — Discharge Instructions (Addendum)
Please pick up the antibiotics that were sent to your pharmacy.  I have also sent prescription strength ibuprofen for you to use as needed.  Please be sure to keep your dentist appointment.

## 2021-04-20 NOTE — ED Provider Notes (Signed)
[facility] EMERGENCY DEPARTMENT Provider Note   CSN: [id number] Arrival date & time: 04/20/21  1428     History Chief Complaint  Patient presents with   Oral Swelling    April Blackburn is a 50 y.o. female presenting today with a complaint of a dental abscess.  Patient was seen by primary doctor and given a prescription for clindamycin.  She became concerned because her doctor gave her return precautions that alarmed her.  Denies any shortness of breath or trouble swallowing at this time.  Pain is 10 out of 10.  Has a appointment with a dentist in January however cannot wait.  Has not picked up the antibiotic.  Denies fever or chills.   Past Medical History:  Diagnosis Date   Anemia    Chronic headaches    Dysmenorrhea    Endometriosis determined by laparoscopy    Hypertension    Menorrhagia with regular cycle    Pelvic pain in female    Vitamin D deficiency     Patient Active Problem List   Diagnosis Date Noted   Diarrhea 11/13/2019   LUQ abdominal pain 11/13/2019   LLQ abdominal pain 11/13/2019    Past Surgical History:  Procedure Laterality Date   PARTIAL HYSTERECTOMY  2016   WISDOM TOOTH EXTRACTION       OB History   No obstetric history on file.     Family History  Problem Relation Age of Onset   Colon polyps Father    Pancreatic cancer Maternal Grandmother    Diabetes Paternal Grandmother    Colon cancer Other    Stomach cancer Other     Social History   Tobacco Use   Smoking status: Every Day    Packs/day: 0.50    Types: Cigarettes   Smokeless tobacco: Never  Vaping Use   Vaping Use: Never used  Substance Use Topics   Alcohol use: Not Currently    Comment: rare 1 drink every 4 - 6 months   Drug use: No    Home Medications Prior to Admission medications   Medication Sig Start Date End Date Taking? Authorizing Provider  amLODipine (NORVASC) 10 MG tablet Take 10 mg by mouth daily.  08/02/19  Yes [provider]   olmesartan-hydrochlorothiazide (BENICAR HCT) 20-12.5 MG tablet Take 1 tablet by mouth daily. 08/02/19  Yes [provider]  Biotin 2.5 MG CAPS Take 1 tablet by mouth daily.    [provider]  dicyclomine (BENTYL) 20 MG tablet Take 1 tablet (20 mg total) by mouth 2 (two) times daily. 11/12/19   Noralyn Pick, NP  ergocalciferol (VITAMIN D2) 1.25 MG (50000 UT) capsule Take by mouth. 08/17/19   [provider]  Loperamide HCl (IMODIUM PO) Take 1 tablet by mouth daily as needed.     [provider]  naproxen (NAPROSYN) 250 MG tablet TAKE 1 TABLET(250 MG) BY MOUTH TWICE DAILY WITH MEALS Patient not taking: No sig reported 12/22/18   [provider]    Allergies    Prednisone, Other, Penicillins, and Tylox [oxycodone-acetaminophen]  Review of Systems   Review of Systems  Constitutional:  Negative for chills and fever.  Respiratory:  Negative for choking and shortness of breath.   Musculoskeletal:  Positive for neck pain.  All other systems reviewed and are negative.  Physical Exam Updated Vital Signs BP (!) 158/83 (BP Location: Right Arm)    Pulse 75    Temp 98.5 F (36.9 C) (Oral)  Resp 18    Ht 5\' 3"  (1.6 m)    Wt 59.9 kg    LMP 08/03/2013    SpO2 95%    BMI 23.38 kg/m   Physical Exam Vitals and nursing note reviewed.  Constitutional:      Appearance: Normal appearance.  HENT:     Head: Normocephalic and atraumatic.     Mouth/Throat:     Mouth: Mucous membranes are moist.     Pharynx: Oropharynx is clear.     Comments: Left lower abscess along the gumline of the 3 molars.  Caries noted on all 3 teeth.  Not actively draining.  Large amounts of left-sided facial swelling Eyes:     General: No scleral icterus.    Conjunctiva/sclera: Conjunctivae normal.  Pulmonary:     Effort: Pulmonary effort is normal. No respiratory distress.  Skin:    Findings: No rash.  Neurological:     Mental Status: She is alert.  Psychiatric:         Mood and Affect: Mood normal.    ED Results / Procedures / Treatments   Labs (all labs ordered are listed, but only abnormal results are displayed) Labs Reviewed - No data to display  EKG None  Radiology No results found.  Procedures .Marland KitchenIncision and Drainage  Date/Time: 04/20/2021 5:23 PM Performed by: Drenda Freeze, MD Authorized by: Drenda Freeze, MD   Consent:    Consent obtained:  Verbal   Consent given by:  Patient   Risks discussed:  Bleeding, incomplete drainage and pain Universal protocol:    Procedure explained and questions answered to patient or proxy's satisfaction: yes   Location:    Type:  Abscess   Size:  2cm   Location:  Mouth   Mouth location:  Alveolar process Sedation:    Sedation type:  None Anesthesia:    Anesthesia method:  Topical application and nerve block   Topical anesthetic:  Benzocaine gel   Block needle gauge:  30 G   Block anesthetic:  Bupivacaine 0.5% WITH epi   Block technique:  Infra alveolar   Block injection procedure:  Anatomic landmarks palpated   Block outcome:  Anesthesia achieved Procedure type:    Complexity:  Complex Procedure details:    Ultrasound guidance: no     Needle aspiration: yes     Incision types:  Stab incision   Drainage:  Serosanguinous   Drainage amount:  Moderate   Wound treatment:  Wound left open Post-procedure details:    Procedure completion:  Tolerated   Medications Ordered in ED Medications  Benzocaine (HURRCAINE) 20 % mouth spray ( Mouth/Throat Given 04/20/21 1644)  bupivacaine-epinephrine (MARCAINE W/ EPI) 0.5% -1:200000 injection 1.8 mL (1.8 mLs Infiltration Given by Other 04/20/21 1645)  clindamycin (CLEOCIN) capsule 300 mg (300 mg Oral Given 04/20/21 1642)    ED Course  I have reviewed the triage vital signs and the nursing notes.  Pertinent labs & imaging results that were available during my care of the patient were reviewed by me and considered in my medical decision  making (see chart for details).    MDM Rules/Calculators/A&P 50 year old female presenting with a dental abscess.  Was seen by primary care first who prescribed her clindamycin that she has not yet picked up.  She requested drainage of the abscess.  Procedure performed by Dr. Darl Householder with my assistance.  Serosanguineous fluid drained from the abscess.  This was done after a dental block.  Patient already prescribed clindamycin that she  can pick up at the pharmacy.  Prescribed ibuprofen for pain and will follow up with the dentist in January.  Final Clinical Impression(s) / ED Diagnoses Final diagnoses:  Dental abscess    Rx / DC Orders Results and diagnoses were explained to the patient. Return precautions discussed in full. Patient had no additional questions and expressed complete understanding.     Rhae Hammock, PA-C 04/20/21 1729    Drenda Freeze, MD 04/20/21 2200

## 2021-11-27 DIAGNOSIS — I10 Essential (primary) hypertension: Secondary | ICD-10-CM | POA: Diagnosis not present

## 2021-12-28 DIAGNOSIS — I10 Essential (primary) hypertension: Secondary | ICD-10-CM | POA: Diagnosis not present

## 2022-01-27 DIAGNOSIS — I10 Essential (primary) hypertension: Secondary | ICD-10-CM | POA: Diagnosis not present

## 2022-02-27 DIAGNOSIS — I10 Essential (primary) hypertension: Secondary | ICD-10-CM | POA: Diagnosis not present

## 2022-03-26 DIAGNOSIS — Z1231 Encounter for screening mammogram for malignant neoplasm of breast: Secondary | ICD-10-CM | POA: Diagnosis not present

## 2022-04-03 DIAGNOSIS — R928 Other abnormal and inconclusive findings on diagnostic imaging of breast: Secondary | ICD-10-CM | POA: Diagnosis not present

## 2022-04-03 DIAGNOSIS — R92321 Mammographic fibroglandular density, right breast: Secondary | ICD-10-CM | POA: Diagnosis not present

## 2022-04-03 DIAGNOSIS — R921 Mammographic calcification found on diagnostic imaging of breast: Secondary | ICD-10-CM | POA: Diagnosis not present

## 2022-04-16 DIAGNOSIS — Z131 Encounter for screening for diabetes mellitus: Secondary | ICD-10-CM | POA: Diagnosis not present

## 2022-04-16 DIAGNOSIS — F17210 Nicotine dependence, cigarettes, uncomplicated: Secondary | ICD-10-CM | POA: Diagnosis not present

## 2022-04-16 DIAGNOSIS — Z122 Encounter for screening for malignant neoplasm of respiratory organs: Secondary | ICD-10-CM | POA: Diagnosis not present

## 2022-04-16 DIAGNOSIS — I10 Essential (primary) hypertension: Secondary | ICD-10-CM | POA: Diagnosis not present

## 2022-04-16 DIAGNOSIS — Z0000 Encounter for general adult medical examination without abnormal findings: Secondary | ICD-10-CM | POA: Diagnosis not present

## 2022-05-31 IMAGING — CT CT CTA ABD/PEL W/CM AND/OR W/O CM
2 of 8 series · 11 of 46 positions shown, 17 images · IV contrast (Omnipaque)
Comparison: None.

CLINICAL DATA: Infectious gastroenteritis/colitis, acute mesenteric
ischemia

EXAM:
CTA ABDOMEN AND PELVIS WITH CONTRAST
TECHNIQUE: Multidetector CT imaging of the abdomen and pelvis was performed
using the standard protocol during bolus administration of
intravenous contrast. Multiplanar reconstructed images and MIPs were
obtained and reviewed to evaluate the vascular anatomy.

[Series 10: axial venous · axial · portal-venous · 0.68mm/px · z∈[-635,-280]mm · 9 of 87 slices shown, 15 images]
[im 8/87  soft-tissue]
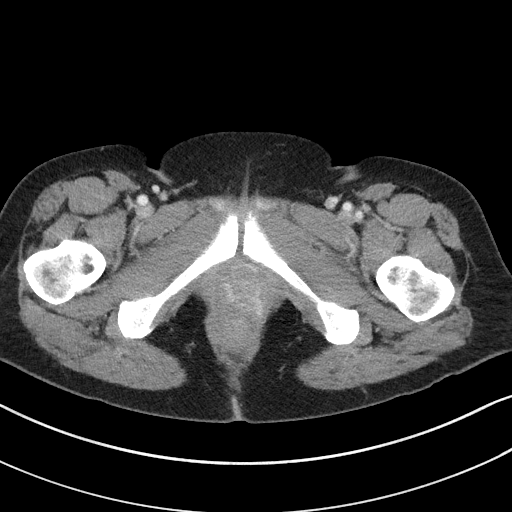
[im 8/87  bone]
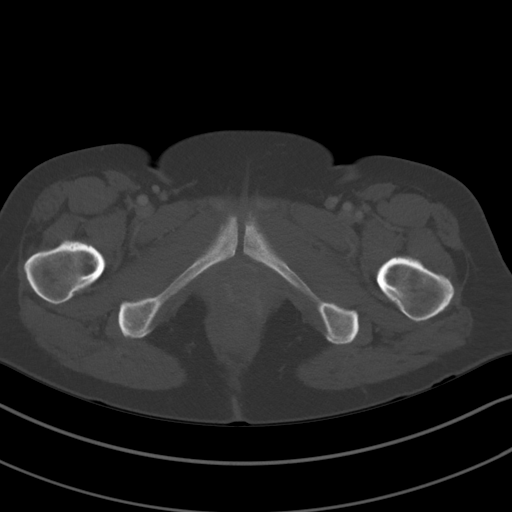
[im 16/87  soft-tissue]
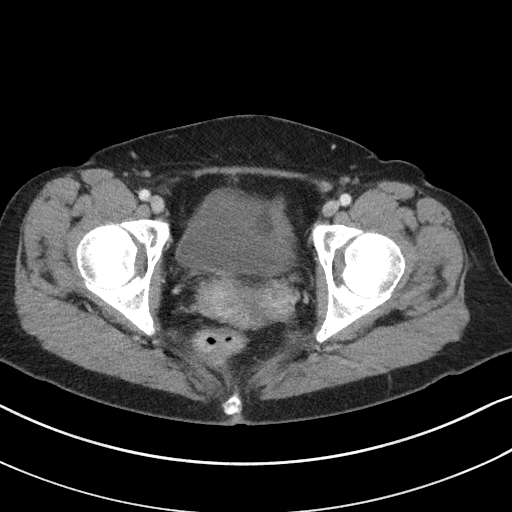
[im 24/87  soft-tissue]
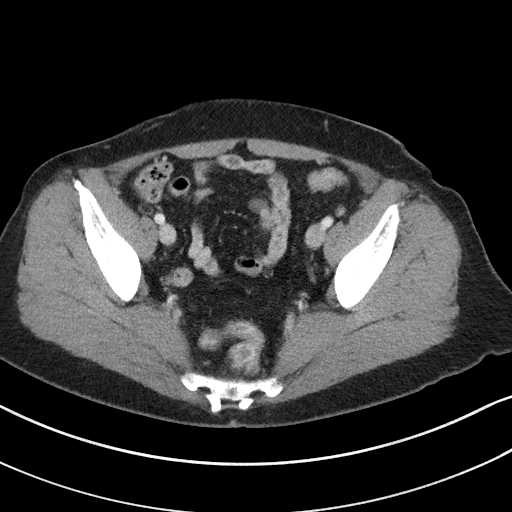
[im 32/87  soft-tissue]
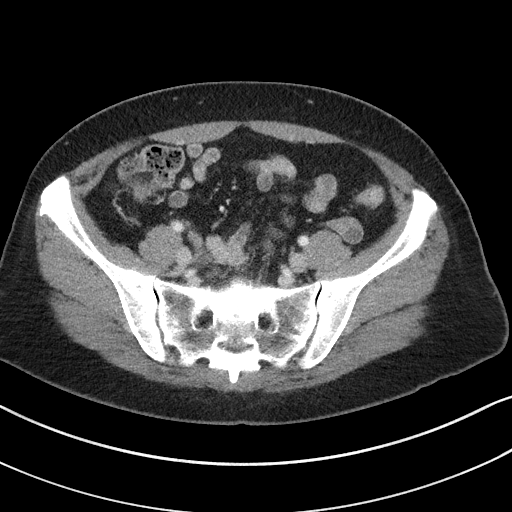
[im 47/87  soft-tissue]
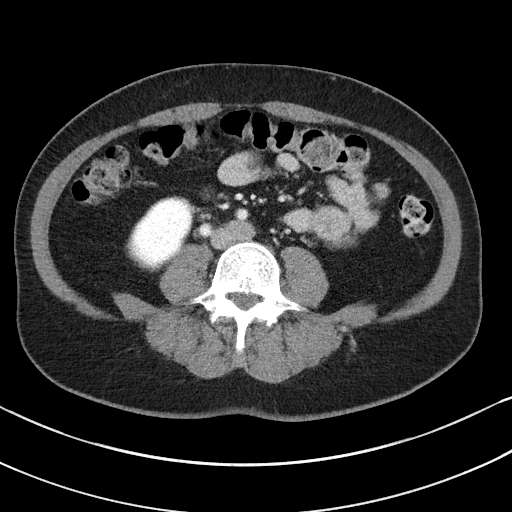
[im 55/87  soft-tissue]
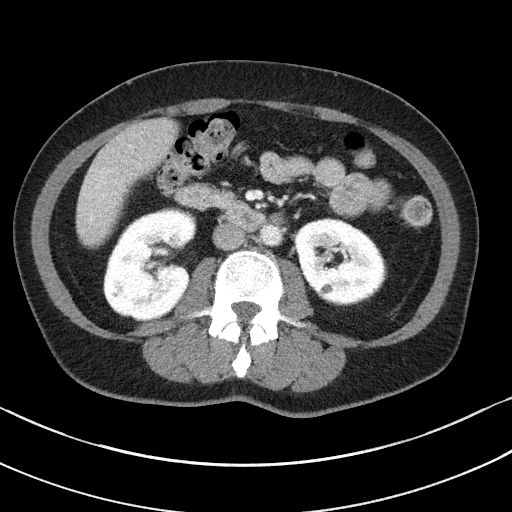
[im 55/87  lung]
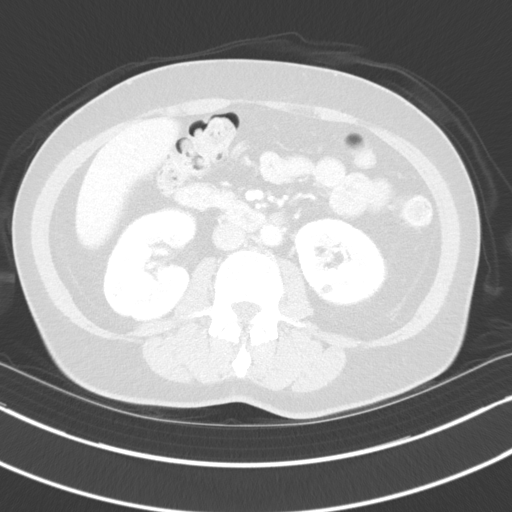
[im 63/87  soft-tissue]
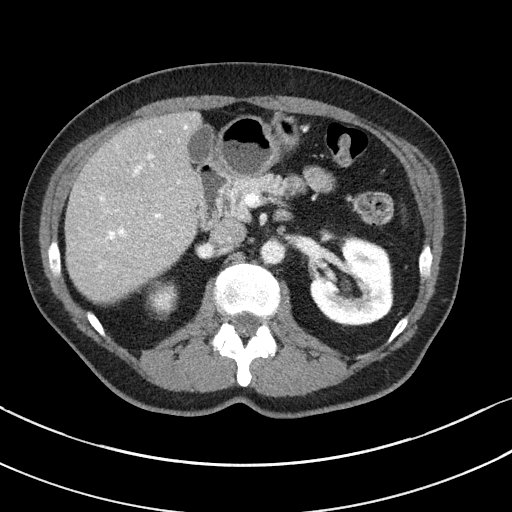
[im 63/87  lung]
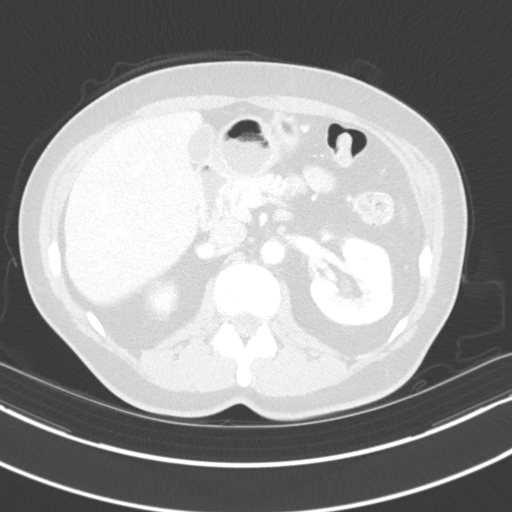
[im 71/87  soft-tissue]
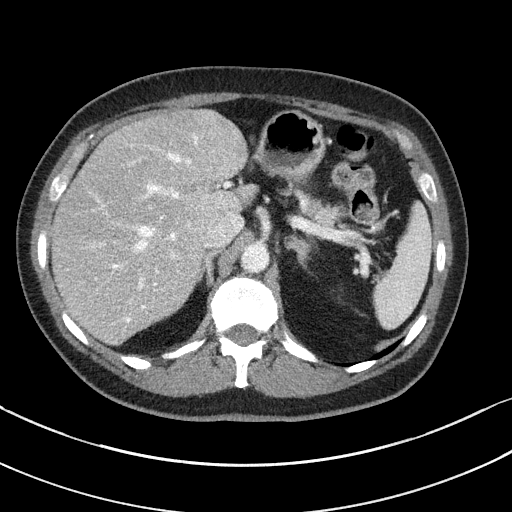
[im 71/87  lung]
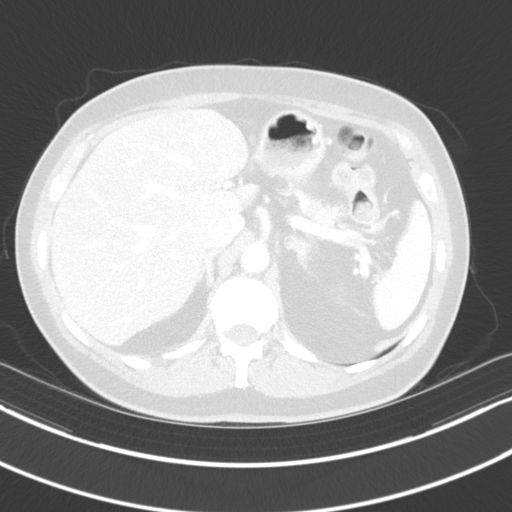
[im 79/87  soft-tissue]
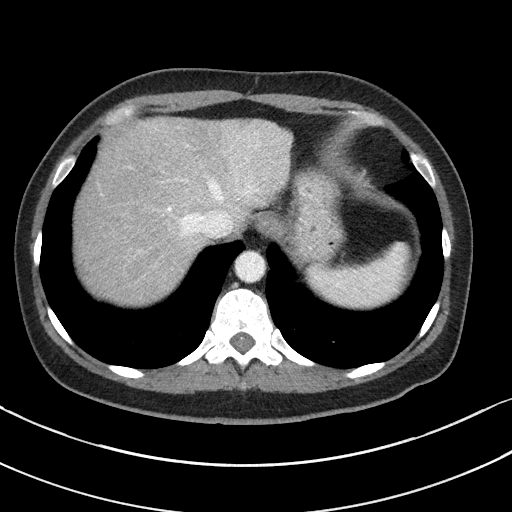
[im 79/87  lung]
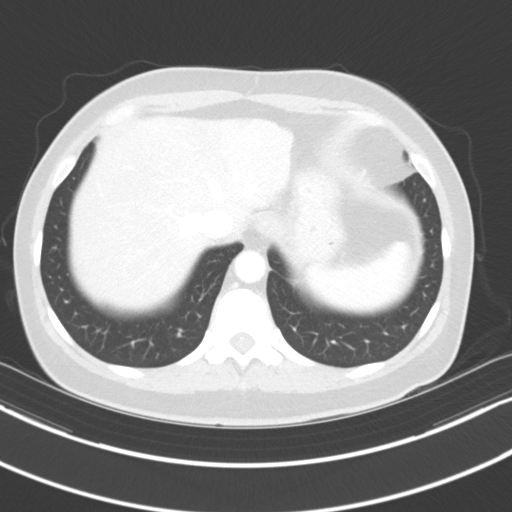
[im 79/87  bone]
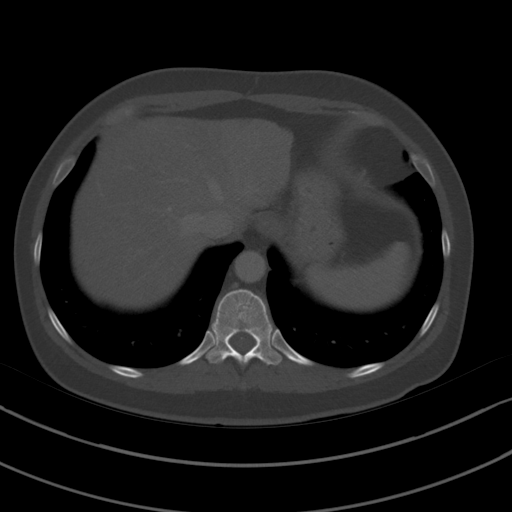

[Series 11: coronals · coronal · 0.65mm/px · 2 of 112 slices shown]
[im 38/112  soft-tissue]
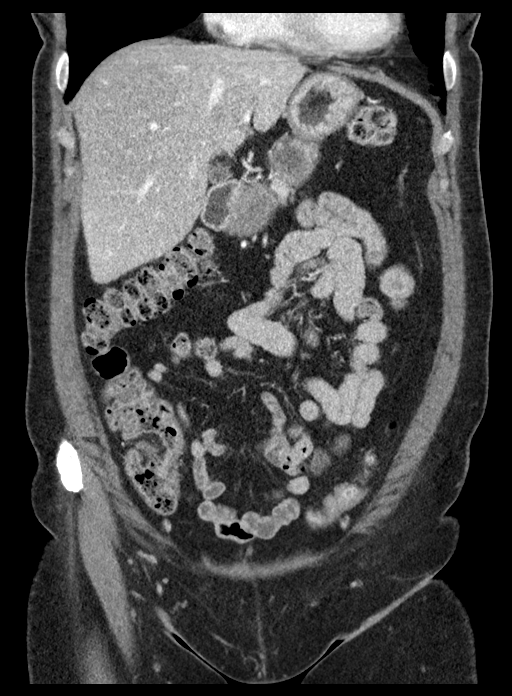
[im 75/112  soft-tissue]
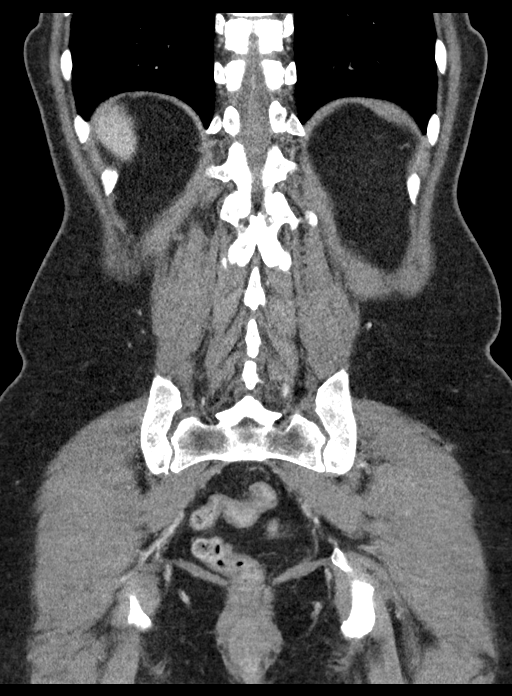

[11 of 46 positions shown; findings below may reference images not displayed]

Multidetector CT imaging of the abdomen and pelvis was performed
using the mesenteric ischemia protocol during bolus administration
of intravenous contrast. Multiplanar reconstructed images and MIPs
were obtained and reviewed to evaluate the vascular anatomy.

CONTRAST:  100mL OMNIPAQUE IOHEXOL 350 MG/ML SOLN
FINDINGS: VASCULAR

Aorta: Normal caliber aorta without aneurysm, dissection, vasculitis
or significant stenosis.

Celiac: Unremarkable

SMA: Unremarkable

Renals: Single renal arteries bilaterally, widely patent.

IMA: Unremarkable

Inflow: Unremarkable

Proximal Outflow: Unremarkable

Veins: Suboptimally opacified within the infrarenal segment, but
otherwise unremarkable. Splenic vein, superior mesenteric vein, and
portal vein are patent. Renal veins are patent.

Review of the MIP images confirms the above findings.

NON-VASCULAR

Lower chest: Visualized lung bases are clear bilaterally. The
visualized heart and pericardium are unremarkable.

Hepatobiliary: Mild hepatic steatosis. Focal fatty hepatic
infiltration adjacent to the falciform ligament. No intra or
extrahepatic biliary ductal dilation. Gallbladder unremarkable.

Pancreas: Unremarkable

Spleen: Unremarkable

Adrenals/Urinary Tract: Unremarkable

Stomach/Bowel: The stomach, small bowel, and large bowel are
unremarkable. Appendix normal. No free intraperitoneal gas or fluid.

Lymphatic: There is no pathologic abdominal or pelvic adenopathy.

Reproductive: Uterus absent. 12 mm cystic lesion within the residual
left ovary is not well characterized on this examination. Right
ovary is unremarkable.

Other: Rectum unremarkable.

Musculoskeletal: No acute bone abnormality.
IMPRESSION: VASCULAR

Normal examination of the abdominal vasculature. No CT evidence of
acute or chronic mesenteric ischemia.

NON-VASCULAR

Mild hepatic steatosis. 12 mm cystic lesion within the residual left
ovary may represent a a dominant follicle in a premenopausal
patient. Otherwise unremarkable examination.

## 2022-10-09 DIAGNOSIS — R92321 Mammographic fibroglandular density, right breast: Secondary | ICD-10-CM | POA: Diagnosis not present

## 2022-10-09 DIAGNOSIS — R928 Other abnormal and inconclusive findings on diagnostic imaging of breast: Secondary | ICD-10-CM | POA: Diagnosis not present

## 2022-11-19 DIAGNOSIS — M25521 Pain in right elbow: Secondary | ICD-10-CM | POA: Diagnosis not present

## 2022-11-19 DIAGNOSIS — M25522 Pain in left elbow: Secondary | ICD-10-CM | POA: Diagnosis not present

## 2022-12-03 DIAGNOSIS — M79642 Pain in left hand: Secondary | ICD-10-CM | POA: Diagnosis not present

## 2022-12-03 DIAGNOSIS — R29898 Other symptoms and signs involving the musculoskeletal system: Secondary | ICD-10-CM | POA: Diagnosis not present

## 2022-12-03 DIAGNOSIS — M79641 Pain in right hand: Secondary | ICD-10-CM | POA: Diagnosis not present

## 2022-12-03 DIAGNOSIS — M25521 Pain in right elbow: Secondary | ICD-10-CM | POA: Diagnosis not present

## 2022-12-10 DIAGNOSIS — M25521 Pain in right elbow: Secondary | ICD-10-CM | POA: Diagnosis not present

## 2022-12-10 DIAGNOSIS — R29898 Other symptoms and signs involving the musculoskeletal system: Secondary | ICD-10-CM | POA: Diagnosis not present

## 2022-12-10 DIAGNOSIS — M79641 Pain in right hand: Secondary | ICD-10-CM | POA: Diagnosis not present

## 2022-12-10 DIAGNOSIS — M25522 Pain in left elbow: Secondary | ICD-10-CM | POA: Diagnosis not present

## 2022-12-17 DIAGNOSIS — R29898 Other symptoms and signs involving the musculoskeletal system: Secondary | ICD-10-CM | POA: Diagnosis not present

## 2022-12-17 DIAGNOSIS — M25522 Pain in left elbow: Secondary | ICD-10-CM | POA: Diagnosis not present

## 2022-12-17 DIAGNOSIS — M79641 Pain in right hand: Secondary | ICD-10-CM | POA: Diagnosis not present

## 2022-12-17 DIAGNOSIS — M25521 Pain in right elbow: Secondary | ICD-10-CM | POA: Diagnosis not present

## 2022-12-24 DIAGNOSIS — M25521 Pain in right elbow: Secondary | ICD-10-CM | POA: Diagnosis not present

## 2022-12-24 DIAGNOSIS — R29898 Other symptoms and signs involving the musculoskeletal system: Secondary | ICD-10-CM | POA: Diagnosis not present

## 2022-12-24 DIAGNOSIS — M25522 Pain in left elbow: Secondary | ICD-10-CM | POA: Diagnosis not present

## 2022-12-24 DIAGNOSIS — M79641 Pain in right hand: Secondary | ICD-10-CM | POA: Diagnosis not present

## 2023-01-02 DIAGNOSIS — M79641 Pain in right hand: Secondary | ICD-10-CM | POA: Diagnosis not present

## 2023-01-02 DIAGNOSIS — R29898 Other symptoms and signs involving the musculoskeletal system: Secondary | ICD-10-CM | POA: Diagnosis not present

## 2023-01-02 DIAGNOSIS — M25522 Pain in left elbow: Secondary | ICD-10-CM | POA: Diagnosis not present

## 2023-01-02 DIAGNOSIS — M25521 Pain in right elbow: Secondary | ICD-10-CM | POA: Diagnosis not present

## 2023-02-25 DIAGNOSIS — M7711 Lateral epicondylitis, right elbow: Secondary | ICD-10-CM | POA: Diagnosis not present

## 2023-02-25 DIAGNOSIS — M1811 Unilateral primary osteoarthritis of first carpometacarpal joint, right hand: Secondary | ICD-10-CM | POA: Diagnosis not present

## 2023-03-03 DIAGNOSIS — M7711 Lateral epicondylitis, right elbow: Secondary | ICD-10-CM | POA: Diagnosis not present

## 2023-04-15 DIAGNOSIS — R921 Mammographic calcification found on diagnostic imaging of breast: Secondary | ICD-10-CM | POA: Diagnosis not present

## 2023-04-15 DIAGNOSIS — R920 Mammographic microcalcification found on diagnostic imaging of breast: Secondary | ICD-10-CM | POA: Diagnosis not present

## 2023-04-15 DIAGNOSIS — R928 Other abnormal and inconclusive findings on diagnostic imaging of breast: Secondary | ICD-10-CM | POA: Diagnosis not present

## 2023-04-15 DIAGNOSIS — R92323 Mammographic fibroglandular density, bilateral breasts: Secondary | ICD-10-CM | POA: Diagnosis not present

## 2023-04-21 DIAGNOSIS — K580 Irritable bowel syndrome with diarrhea: Secondary | ICD-10-CM | POA: Diagnosis not present

## 2023-04-21 DIAGNOSIS — Z13220 Encounter for screening for lipoid disorders: Secondary | ICD-10-CM | POA: Diagnosis not present

## 2023-04-21 DIAGNOSIS — Z0000 Encounter for general adult medical examination without abnormal findings: Secondary | ICD-10-CM | POA: Diagnosis not present

## 2023-04-21 DIAGNOSIS — Z1330 Encounter for screening examination for mental health and behavioral disorders, unspecified: Secondary | ICD-10-CM | POA: Diagnosis not present

## 2023-04-21 DIAGNOSIS — Z131 Encounter for screening for diabetes mellitus: Secondary | ICD-10-CM | POA: Diagnosis not present

## 2023-04-21 DIAGNOSIS — H6122 Impacted cerumen, left ear: Secondary | ICD-10-CM | POA: Diagnosis not present

## 2023-04-21 DIAGNOSIS — E785 Hyperlipidemia, unspecified: Secondary | ICD-10-CM | POA: Diagnosis not present

## 2023-04-21 DIAGNOSIS — D500 Iron deficiency anemia secondary to blood loss (chronic): Secondary | ICD-10-CM | POA: Diagnosis not present

## 2023-04-21 DIAGNOSIS — E559 Vitamin D deficiency, unspecified: Secondary | ICD-10-CM | POA: Diagnosis not present

## 2023-04-21 DIAGNOSIS — I10 Essential (primary) hypertension: Secondary | ICD-10-CM | POA: Diagnosis not present

## 2023-04-30 DIAGNOSIS — E119 Type 2 diabetes mellitus without complications: Secondary | ICD-10-CM | POA: Diagnosis not present

## 2023-05-31 DIAGNOSIS — E119 Type 2 diabetes mellitus without complications: Secondary | ICD-10-CM | POA: Diagnosis not present

## 2023-06-28 DIAGNOSIS — E119 Type 2 diabetes mellitus without complications: Secondary | ICD-10-CM | POA: Diagnosis not present

## 2023-07-29 DIAGNOSIS — E119 Type 2 diabetes mellitus without complications: Secondary | ICD-10-CM | POA: Diagnosis not present

## 2023-09-12 DIAGNOSIS — Z1330 Encounter for screening examination for mental health and behavioral disorders, unspecified: Secondary | ICD-10-CM | POA: Diagnosis not present

## 2023-09-12 DIAGNOSIS — K5900 Constipation, unspecified: Secondary | ICD-10-CM | POA: Diagnosis not present

## 2023-09-12 DIAGNOSIS — K580 Irritable bowel syndrome with diarrhea: Secondary | ICD-10-CM | POA: Diagnosis not present

## 2023-09-12 DIAGNOSIS — R1012 Left upper quadrant pain: Secondary | ICD-10-CM | POA: Diagnosis not present

## 2023-09-30 DIAGNOSIS — R630 Anorexia: Secondary | ICD-10-CM | POA: Diagnosis not present

## 2023-09-30 DIAGNOSIS — R7303 Prediabetes: Secondary | ICD-10-CM | POA: Diagnosis not present

## 2023-09-30 DIAGNOSIS — I10 Essential (primary) hypertension: Secondary | ICD-10-CM | POA: Diagnosis not present

## 2023-09-30 DIAGNOSIS — K580 Irritable bowel syndrome with diarrhea: Secondary | ICD-10-CM | POA: Diagnosis not present

## 2023-09-30 DIAGNOSIS — R634 Abnormal weight loss: Secondary | ICD-10-CM | POA: Diagnosis not present

## 2023-09-30 DIAGNOSIS — E559 Vitamin D deficiency, unspecified: Secondary | ICD-10-CM | POA: Diagnosis not present

## 2023-10-03 ENCOUNTER — Other Ambulatory Visit: Payer: Self-pay

## 2023-10-03 DIAGNOSIS — F17200 Nicotine dependence, unspecified, uncomplicated: Secondary | ICD-10-CM

## 2023-10-15 DIAGNOSIS — R921 Mammographic calcification found on diagnostic imaging of breast: Secondary | ICD-10-CM | POA: Diagnosis not present

## 2023-10-15 DIAGNOSIS — R928 Other abnormal and inconclusive findings on diagnostic imaging of breast: Secondary | ICD-10-CM | POA: Diagnosis not present

## 2023-10-15 DIAGNOSIS — R92321 Mammographic fibroglandular density, right breast: Secondary | ICD-10-CM | POA: Diagnosis not present

## 2023-10-21 DIAGNOSIS — K580 Irritable bowel syndrome with diarrhea: Secondary | ICD-10-CM | POA: Diagnosis not present

## 2023-10-21 DIAGNOSIS — F17200 Nicotine dependence, unspecified, uncomplicated: Secondary | ICD-10-CM | POA: Diagnosis not present

## 2023-10-21 DIAGNOSIS — E559 Vitamin D deficiency, unspecified: Secondary | ICD-10-CM | POA: Diagnosis not present

## 2023-10-21 DIAGNOSIS — I10 Essential (primary) hypertension: Secondary | ICD-10-CM | POA: Diagnosis not present

## 2024-04-19 ENCOUNTER — Other Ambulatory Visit: Payer: Self-pay

## 2024-04-19 ENCOUNTER — Emergency Department (HOSPITAL_BASED_OUTPATIENT_CLINIC_OR_DEPARTMENT_OTHER)
Admission: EM | Admit: 2024-04-19 | Discharge: 2024-04-19 | Disposition: A | Discharge status: Home / Self Care | Attending: Emergency Medicine | Admitting: Emergency Medicine

## 2024-04-19 DIAGNOSIS — S39012A Strain of muscle, fascia and tendon of lower back, initial encounter: Secondary | ICD-10-CM | POA: Diagnosis not present

## 2024-04-19 DIAGNOSIS — S060X0A Concussion without loss of consciousness, initial encounter: Secondary | ICD-10-CM | POA: Diagnosis not present

## 2024-04-19 DIAGNOSIS — Y92410 Unspecified street and highway as the place of occurrence of the external cause: Secondary | ICD-10-CM | POA: Insufficient documentation

## 2024-04-19 DIAGNOSIS — S0990XA Unspecified injury of head, initial encounter: Secondary | ICD-10-CM | POA: Diagnosis present

## 2024-04-19 DIAGNOSIS — Z79899 Other long term (current) drug therapy: Secondary | ICD-10-CM | POA: Insufficient documentation

## 2024-04-19 NOTE — Discharge Instructions (Signed)
 As we discussed, you can continue to use ibuprofen  as needed for your back strain, and also use a heating pad on the affected area to help with pain relief.  If you start to notice any of the red flag symptoms that we discussed such as the inability to control yourself with your bowel or your bladder, new onset of numbness between your legs, or new weakness or pain please come back to the emergency department immediately for reevaluation.  Otherwise follow-up with your primary care within next 2 weeks and also follow-up with the sports medicine referral regarding your concussion.
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
